# Patient Record
Sex: Male | Born: 1980 | Hispanic: No | Marital: Married | Smoking: Never smoker
Health system: Southern US, Community
[De-identification: ages and names within clinical notes are randomized; demographics above are authoritative.]

## PROBLEM LIST (undated history)

## (undated) DIAGNOSIS — E785 Hyperlipidemia, unspecified: Secondary | ICD-10-CM

## (undated) HISTORY — DX: Hyperlipidemia, unspecified: E78.5

---

## 2016-11-05 ENCOUNTER — Ambulatory Visit
Admission: RE | Admit: 2016-11-05 | Discharge: 2016-11-05 | Disposition: A | Discharge status: Home / Self Care | Payer: No Typology Code available for payment source | Source: Ambulatory Visit | Attending: Occupational Medicine | Admitting: Occupational Medicine

## 2016-11-05 ENCOUNTER — Other Ambulatory Visit: Payer: Self-pay | Admitting: Occupational Medicine

## 2016-11-05 DIAGNOSIS — Z021 Encounter for pre-employment examination: Secondary | ICD-10-CM

## 2018-07-14 IMAGING — CR DG CHEST 1V
1 series · 1 of 1 positions shown · non-contrast
Comparison: None.

CLINICAL DATA: Pre deployment examination.

EXAM:
CHEST 1 VIEW

[w chest pa]
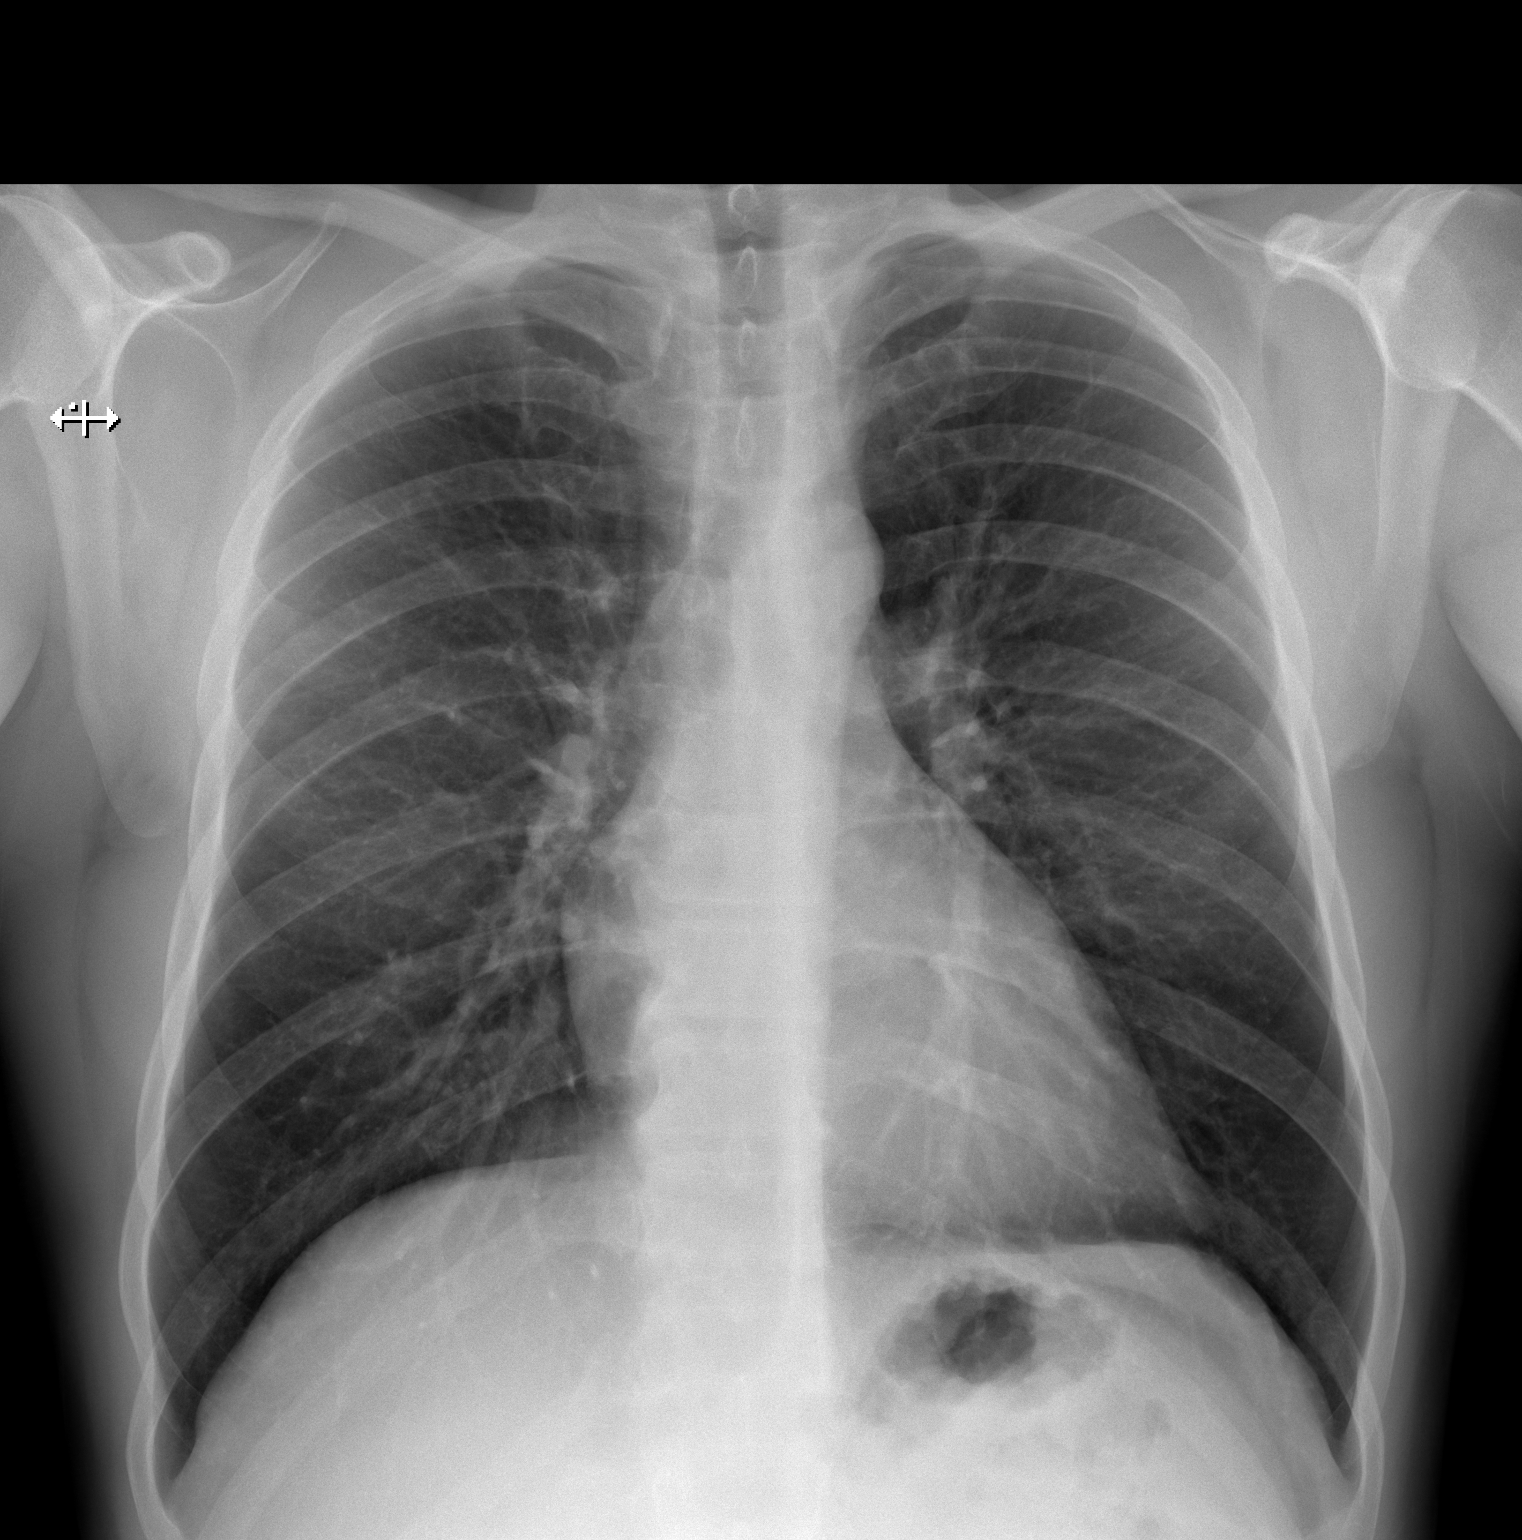

[1 of 1 positions shown; findings below may reference images not displayed]

FINDINGS: The heart size and mediastinal contours are within normal limits.
Both lungs are clear. The visualized skeletal structures are
unremarkable.
IMPRESSION: No active disease.

## 2019-01-04 ENCOUNTER — Other Ambulatory Visit: Payer: Self-pay

## 2019-01-04 ENCOUNTER — Encounter (HOSPITAL_COMMUNITY): Payer: Self-pay

## 2019-01-04 ENCOUNTER — Ambulatory Visit (HOSPITAL_COMMUNITY)
Admission: EM | Admit: 2019-01-04 | Discharge: 2019-01-04 | Disposition: A | Discharge status: Home / Self Care | Payer: No Typology Code available for payment source | Attending: Internal Medicine | Admitting: Internal Medicine

## 2019-01-04 DIAGNOSIS — W269XXA Contact with unspecified sharp object(s), initial encounter: Secondary | ICD-10-CM

## 2019-01-04 DIAGNOSIS — S61215A Laceration without foreign body of left ring finger without damage to nail, initial encounter: Secondary | ICD-10-CM | POA: Diagnosis not present

## 2019-01-04 DIAGNOSIS — S61214A Laceration without foreign body of right ring finger without damage to nail, initial encounter: Secondary | ICD-10-CM

## 2019-01-04 MED ORDER — LIDOCAINE HCL (PF) 1 % IJ SOLN
INTRAMUSCULAR | Status: AC
  Filled 2019-01-04: qty 30

## 2019-01-04 NOTE — ED Provider Notes (Addendum)
[place]    CSN: [id number] Arrival date & time: 01/04/19  1704      History   Chief Complaint Chief Complaint  Patient presents with  . Extremity Laceration    HPI Terry Porter is a 38 y.o. male with no past medical history comes to urgent care for left ring Finger laceration that occurred this afternoon.  Injury was accidental.  Bleeding is currently stopped.  Patient has pain which is sharp, constant and severe.  Aggravated by palpation and movement of the fingers.  No known relieving factors.  Patient was at rest the shopping Fruit or Vegetables When He Sustained the Cut.   HPI  History reviewed. No pertinent past medical history.  There are no active problems to display for this patient.   History reviewed. No pertinent surgical history.     Home Medications    Prior to Admission medications   Not on File    Family History Family History  Problem Relation Age of Onset  . Healthy Mother     Social History Social History   Tobacco Use  . Smoking status: Never Smoker  . Smokeless tobacco: Never Used  Substance Use Topics  . Alcohol use: Yes    Comment: sometimes  . Drug use: Not on file     Allergies   Patient has no known allergies.   Review of Systems Review of Systems  Constitutional: Negative.   HENT: Negative.   Respiratory: Negative.   Gastrointestinal: Negative.   Musculoskeletal: Negative.   Skin: Positive for color change and wound.  Neurological: Negative.      Physical Exam Triage Vital Signs ED Triage Vitals  Enc Vitals Group     BP 01/04/19 1727 (!) 155/99     Pulse Rate 01/04/19 1727 73     Resp 01/04/19 1727 15     Temp 01/04/19 1727 98.4 F (36.9 C)     Temp Source 01/04/19 1727 Temporal     SpO2 01/04/19 1727 100 %     Weight --      Height --      Head Circumference --      Peak Flow --      Pain Score 01/04/19 1726 3     Pain Loc --      Pain Edu? --      Excl. in [place]? --    No data  found.  Updated Vital Signs BP (!) 155/99 (BP Location: Right Arm)   Pulse 73   Temp 98.4 F (36.9 C) (Temporal)   Resp 15   SpO2 100%   Visual Acuity Right Eye Distance:   Left Eye Distance:   Bilateral Distance:    Right Eye Near:   Left Eye Near:    Bilateral Near:     Physical Exam Vitals signs and nursing note reviewed.  Constitutional:      General: He is in acute distress.     Appearance: He is normal weight. He is not ill-appearing.  Cardiovascular:     Rate and Rhythm: Normal rate and regular rhythm.     Pulses: Normal pulses.     Heart sounds: Normal heart sounds.  Pulmonary:     Effort: Pulmonary effort is normal.     Breath sounds: Normal breath sounds.  Musculoskeletal:     Comments: Laceration over the distal anterior aspect of the left ring finger.  No bleeding at this time.  Finger is swollen.  The finger nail and nailbed  is intact.  Skin:    General: Skin is warm and dry.     Capillary Refill: Capillary refill takes less than 2 seconds.  Neurological:     General: No focal deficit present.     Mental Status: He is alert.      UC Treatments / Results  Labs (all labs ordered are listed, but only abnormal results are displayed) Labs Reviewed - No data to display  EKG   Radiology No results found.  Procedures Laceration Repair  Date/Time: 01/04/2019 6:20 PM Performed by: Merrilee JanskyLamptey, Eathel Pajak O, MD Authorized by: Merrilee JanskyLamptey, Norrine Ballester O, MD   Consent:    Consent obtained:  Verbal   Consent given by:  Patient   Risks discussed:  Infection, pain, poor cosmetic result and tendon damage   Alternatives discussed:  No treatment and delayed treatment Anesthesia (see MAR for exact dosages):    Anesthesia method:  Local infiltration   Local anesthetic:  Lidocaine 1% w/o epi Laceration details:    Location:  Finger   Finger location:  L ring finger   Length (cm):  3   Depth (mm):  5 Repair type:    Repair type:  Simple Pre-procedure details:     Preparation:  Patient was prepped and draped in usual sterile fashion Exploration:    Hemostasis achieved with:  Direct pressure   Wound exploration: wound explored through full range of motion     Contaminated: no   Treatment:    Area cleansed with:  Betadine and saline   Amount of cleaning:  Standard   Irrigation solution:  Sterile saline   Visualized foreign bodies/material removed: no   Skin repair:    Repair method:  Sutures   Suture size:  4-0   Suture material:  Prolene   Suture technique:  Simple interrupted Approximation:    Approximation:  Close Post-procedure details:    Dressing:  Non-adherent dressing   Patient tolerance of procedure:  Tolerated well, no immediate complications   (including critical care time)  Medications Ordered in UC Medications  lidocaine (PF) (XYLOCAINE) 1 % injection (has no administration in time range)    Initial Impression / Assessment and Plan / UC Course  I have reviewed the triage vital signs and the nursing notes.  Pertinent labs & imaging results that were available during my care of the patient were reviewed by me and considered in my medical decision making (see chart for details).     1.  Laceration over the left ring finger. Laceration repair carried out Over-the-counter pain medications as needed Suture removal in 5 to 7 days Suture wound care education was given to the patient If patient notices worsening swelling, pain, redness, fever or chills or any other signs of infection patient is advised to return to urgent care to be reevaluated. Final Clinical Impressions(s) / UC Diagnoses   Final diagnoses:  Laceration of right ring finger without foreign body without damage to nail, initial encounter   Discharge Instructions   None    ED Prescriptions    None     Controlled Substance Prescriptions [place] Controlled Substance Registry consulted? No   Merrilee JanskyLamptey, Madelynne Lasker O, MD 01/09/19 1047    Merrilee JanskyLamptey, Draiden Mirsky O, MD  01/09/19 1050

## 2019-01-04 NOTE — ED Triage Notes (Signed)
Patient presents to Urgent Care with complaints of laceration from knife to left ring fingger since this afternoon. 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 Pt states tetanus is up to date.

## 2022-03-03 NOTE — Progress Notes (Unsigned)
HPI: [name] is a 41 y.o. male with medical hx significant for HLD here today to establish care.  Former PCP: N/A Last preventive routine visit: Many years ago. He has annual exams through work that include blood work and vision.  He reports no history of hypertension, diabetes, or depression.  He had FLP done in 02/2021: ***  level of around 212-220 during his last physical exam in November of the previous year. 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  He has a family history of drug use in his father, who is deceased, and a healthy 51 year old mother who is still working. There is no family history of cancer.  Review of Systems Rest see pertinent positives and negatives per HPI.  No current outpatient medications on file prior to visit.   No current facility-administered medications on file prior to visit.   History reviewed. No pertinent past medical history. No Known Allergies  Family History  Problem Relation Age of Onset   Healthy Mother    Social History   Socioeconomic History   Marital status: Married    Spouse name: Not on file   Number of children: Not on file   Years of education: Not on file   Highest education level: Not on file  Occupational History   Not on file  Tobacco Use   Smoking status: Never   Smokeless tobacco: Never  Vaping Use   Vaping Use: Never used  Substance and Sexual Activity   Alcohol use: Yes    Comment: sometimes   Drug use: Not on file   Sexual activity: Not on file  Other Topics Concern   Not on file  Social History Narrative   Not on file   Social Determinants of Health   Financial Resource Strain: Not on file  Food Insecurity: Not on file  Transportation Needs: Not on file  Physical Activity: Not on file  Stress: Not on file  Social Connections: Not on file   Vitals:   03/05/22 1110  BP: 120/80  Pulse: 70  Resp: 12  SpO2: 97%  Body mass index is 28.84 kg/m.  Physical  Exam Nursing note reviewed.  Constitutional:      General: He is not in acute distress.    Appearance: He is well-developed.  HENT:     Head: Normocephalic and atraumatic.  Eyes:     Conjunctiva/sclera: Conjunctivae normal.  Cardiovascular:     Rate and Rhythm: Normal rate and regular rhythm.     Pulses:          Dorsalis pedis pulses are 2+ on the right side and 2+ on the left side.     Heart sounds: No murmur heard. Pulmonary:     Effort: Pulmonary effort is normal. No respiratory distress.     Breath sounds: Normal breath sounds.  Abdominal:     Palpations: Abdomen is soft. There is no hepatomegaly or mass.     Tenderness: There is no abdominal tenderness.  Lymphadenopathy:     Cervical: No cervical adenopathy.  Skin:    General: Skin is warm.     Findings: No erythema or rash.  Neurological:     Mental Status: He is alert and oriented to person, place, and time.     Cranial Nerves: No cranial nerve deficit.     Gait: Gait normal.  Psychiatric:     Comments: Well groomed, good eye contact.   ASSESSMENT AND PLAN:  [name] was seen today for  establish care.  Diagnoses and all orders for this visit:  Hyperlipidemia, unspecified hyperlipidemia type  Tdap 5 years. *** Return in about 1 year (around 03/06/2023) for cpe.  Terry Govan G. [place], MD  [facility]. [place] office.

## 2022-03-05 ENCOUNTER — Ambulatory Visit (INDEPENDENT_AMBULATORY_CARE_PROVIDER_SITE_OTHER): Payer: 59 | Admitting: Family Medicine

## 2022-03-05 ENCOUNTER — Encounter: Payer: Self-pay | Admitting: Family Medicine

## 2022-03-05 VITALS — BP 120/80 | HR 70 | Resp 12 | Ht 70.0 in | Wt 201.0 lb

## 2022-03-05 DIAGNOSIS — E785 Hyperlipidemia, unspecified: Secondary | ICD-10-CM

## 2022-03-05 NOTE — Patient Instructions (Addendum)
A few things to remember from today's visit:  Hyperlipidemia, unspecified hyperlipidemia type  If you can have blood work results faxed to mailed to [place], so we can see the numbers. I think annual visits are appropriate. We can do fasting labs next year.  Do not use My Chart to request refills or for acute issues that need immediate attention. If you send a my chart message, it may take a few days to be addressed, specially if I am not in the office.  Please be sure medication list is accurate. If a new problem present, please set up appointment sooner than planned today.

## 2022-03-06 ENCOUNTER — Encounter: Payer: Self-pay | Admitting: Family Medicine

## 2022-03-08 ENCOUNTER — Ambulatory Visit: Payer: Self-pay | Admitting: Family Medicine

## 2022-04-27 ENCOUNTER — Telehealth: Payer: Self-pay

## 2022-04-27 NOTE — Telephone Encounter (Signed)
Can you set him up for a follow up appointment to discuss the EKG that he dropped off? It can be virtual if that is easier for him.

## 2022-04-27 NOTE — Telephone Encounter (Signed)
I spoke with patient. He is not having any symptoms and per pcp's note, referral is not needed as long as no symptoms.

## 2022-12-12 LAB — CBC AND DIFFERENTIAL
HCT: 43 (ref 41–53)
Hemoglobin: 14.7 (ref 13.5–17.5)
Platelets: 241 10*3/uL (ref 150–400)
WBC: 6.4

## 2022-12-12 LAB — BASIC METABOLIC PANEL
BUN: 23 — AB (ref 4–21)
CO2: 27 — AB (ref 13–22)
Chloride: 106 (ref 99–108)
Creatinine: 1.1 (ref 0.6–1.3)
Glucose: 85
Potassium: 4.6 meq/L (ref 3.5–5.1)
Sodium: 140 (ref 137–147)

## 2022-12-12 LAB — HEPATIC FUNCTION PANEL
ALT: 22 U/L (ref 10–40)
AST: 20 (ref 14–40)
Alkaline Phosphatase: 59 (ref 25–125)
Bilirubin, Total: 0.8

## 2022-12-12 LAB — LIPID PANEL
Cholesterol: 239 — AB (ref 0–200)
HDL: 43 (ref 35–70)
LDL Cholesterol: 162
LDl/HDL Ratio: 5.6
Triglycerides: 188 — AB (ref 40–160)

## 2022-12-12 LAB — COMPREHENSIVE METABOLIC PANEL
Albumin: 4.6 (ref 3.5–5.0)
Calcium: 9.7 (ref 8.7–10.7)
EGFR: 90
Globulin: 2.5

## 2022-12-12 LAB — CBC: RBC: 5 (ref 3.87–5.11)

## 2022-12-12 LAB — PSA: PSA: 0.48

## 2023-03-30 ENCOUNTER — Encounter: Payer: Self-pay | Admitting: Family Medicine

## 2023-03-30 ENCOUNTER — Ambulatory Visit: Payer: 59 | Admitting: Family Medicine

## 2023-03-30 VITALS — BP 124/80 | HR 76 | Resp 12 | Ht 70.0 in | Wt 197.0 lb

## 2023-03-30 DIAGNOSIS — E785 Hyperlipidemia, unspecified: Secondary | ICD-10-CM | POA: Diagnosis not present

## 2023-03-30 DIAGNOSIS — Z1159 Encounter for screening for other viral diseases: Secondary | ICD-10-CM | POA: Diagnosis not present

## 2023-03-30 DIAGNOSIS — Z0000 Encounter for general adult medical examination without abnormal findings: Secondary | ICD-10-CM | POA: Diagnosis not present

## 2023-03-30 DIAGNOSIS — M25671 Stiffness of right ankle, not elsewhere classified: Secondary | ICD-10-CM

## 2023-03-30 NOTE — Assessment & Plan Note (Signed)
Problem seems to be getting worse, LDL went from 146 to 162, not HDL 196.  We discussed pharmacologic treatment options, he is not interested in starting medication. He will try to work on his diet and would like to have lab repeated in 6 months.

## 2023-03-30 NOTE — Progress Notes (Signed)
HPI: [name] is a 42 y.o.male with a PMHx significant for HLD who is here today for his routine physical examination.  Last CPE: not on file  Exercise: Patient states he runs 7-9 times per week for at least 8 miles and also lifts weights.  Diet: He eats healthy in general. He eats a combination of chicken, fish, and red meat, and eats vegetables daily.  Sleep: His sleep schedule is sligthly varied with his work, but he says he averages a little over 7 hours.  Alcohol Use: He says he drinks 1-2 times per month.  Smoking: never Vision: He has his eyes checked through his job, but they aren't dilated.  Dental: UTD on routine dental care.    There is no immunization history on file for this patient.  Health Maintenance  Topic Date Due   Hepatitis C Screening  Never done   DTaP/Tdap/Td (1 - Tdap) Never done   COVID-19 Vaccine (1 - 2023-24 season) Never done   INFLUENZA VACCINE  07/25/2023 (Originally 11/25/2022)   HIV Screening  03/07/2027 (Originally 02/05/1996)   HPV VACCINES  Aged Out   Last prostate ca screening:  His PSA was 0.4 on 11/29/2022, this is part of labs he has annually through work.  Chronic medical problems:   Hyperlipidemia: Not currently on pharmacologic treatment.  Last FLP through work Fasting glucose 85.  Lab Results  Component Value Date   CHOL 239 (A) 12/12/2022   HDL 43 12/12/2022   LDLCALC 162 12/12/2022   TRIG 188 (A) 12/12/2022   Non HDL Cholesterol: 196 Lab Results  Component Value Date   ALT 22 12/12/2022   AST 20 12/12/2022   ALKPHOS 59 12/12/2022   Lab Results  Component Value Date   NA 140 12/12/2022   CL 106 12/12/2022   K 4.6 12/12/2022   CO2 27 (A) 12/12/2022   BUN 23 (A) 12/12/2022   CREATININE 1.1 12/12/2022   CALCIUM 9.7 12/12/2022   ALBUMIN 4.6 12/12/2022   Right ankle range of motion:  He asks about a referral to discuss non invasive treatment options for limited range of motion in his right ankle for 13  years since an injury while playing soccer. He denies pain but after a long run he can feel some achy pain in right hip and lower back, which he thinks are related. Negative for edema or erythema.  Review of Systems  Constitutional:  Negative for activity change, appetite change and fever.  HENT:  Negative for nosebleeds, sore throat and trouble swallowing.   Eyes:  Negative for redness and visual disturbance.  Respiratory:  Negative for cough, shortness of breath and wheezing.   Cardiovascular:  Negative for chest pain, palpitations and leg swelling.  Gastrointestinal:  Negative for abdominal pain, blood in stool, nausea and vomiting.  Endocrine: Negative for cold intolerance, heat intolerance, polydipsia, polyphagia and polyuria.  Genitourinary:  Negative for decreased urine volume, dysuria, genital sores, hematuria and testicular pain.  Musculoskeletal:  Negative for gait problem and joint swelling.  Skin:  Negative for color change and rash.  Allergic/Immunologic: Negative for environmental allergies.  Neurological:  Negative for dizziness, syncope, weakness, numbness and headaches.  Hematological:  Negative for adenopathy. Does not bruise/bleed easily.  Psychiatric/Behavioral:  Negative for confusion and sleep disturbance.   All other systems reviewed and are negative.  No current outpatient medications on file prior to visit.   No current facility-administered medications on file prior to visit.   Past Medical History:  Diagnosis Date   Hyperlipidemia    History reviewed. No pertinent surgical history.  No Known Allergies  Family History  Problem Relation Age of Onset   Healthy Mother    Social History   Socioeconomic History   Marital status: Married    Spouse name: Not on file   Number of children: Not on file   Years of education: Not on file   Highest education level: Not on file  Occupational History   Not on file  Tobacco Use   Smoking status: Never    Smokeless tobacco: Never  Vaping Use   Vaping status: Never Used  Substance and Sexual Activity   Alcohol use: Yes    Comment: sometimes   Drug use: Not on file   Sexual activity: Not on file  Other Topics Concern   Not on file  Social History Narrative   Not on file   Social Determinants of Health   Financial Resource Strain: Not on file  Food Insecurity: Not on file  Transportation Needs: Not on file  Physical Activity: Not on file  Stress: Not on file  Social Connections: Not on file   Vitals:   03/30/23 1351  BP: 124/80  Pulse: 76  Resp: 12  SpO2: 98%   Body mass index is 28.27 kg/m.  Wt Readings from Last 3 Encounters:  03/30/23 197 lb (89.4 kg)  03/05/22 201 lb (91.2 kg)   Physical Exam Vitals and nursing note reviewed.  Constitutional:      General: He is not in acute distress.    Appearance: He is well-developed.  HENT:     Head: Normocephalic and atraumatic.     Right Ear: Tympanic membrane, ear canal and external ear normal.     Left Ear: Tympanic membrane, ear canal and external ear normal.     Mouth/Throat:     Mouth: Mucous membranes are moist.     Pharynx: Oropharynx is clear. Uvula midline.  Eyes:     Extraocular Movements:     Right eye: Abnormal extraocular motion (Strabismus) present.     Conjunctiva/sclera: Conjunctivae normal.     Pupils: Pupils are equal, round, and reactive to light.     Comments: .  Neck:     Thyroid: No thyroid mass or thyromegaly.  Cardiovascular:     Rate and Rhythm: Normal rate and regular rhythm.     Pulses:          Dorsalis pedis pulses are 2+ on the right side and 2+ on the left side.     Heart sounds: No murmur heard. Pulmonary:     Effort: Pulmonary effort is normal. No respiratory distress.     Breath sounds: Normal breath sounds.  Abdominal:     Palpations: Abdomen is soft. There is no hepatomegaly or mass.     Tenderness: There is no abdominal tenderness.  Genitourinary:    Comments: No  concerns. Musculoskeletal:        General: No tenderness.     Cervical back: Normal range of motion.     Right lower leg: No edema.     Left lower leg: No edema.     Comments: No major deformities appreciated and no signs of synovitis. No significant limitation of right ankle flexion. No pain with ROM.  Lymphadenopathy:     Cervical: No cervical adenopathy.     Upper Body:     Right upper body: No supraclavicular adenopathy.     Left upper body: No  supraclavicular adenopathy.  Skin:    General: Skin is warm.     Findings: No erythema.  Neurological:     General: No focal deficit present.     Mental Status: He is alert and oriented to person, place, and time.     Cranial Nerves: No cranial nerve deficit.     Sensory: No sensory deficit.     Motor: No weakness.     Gait: Gait normal.     Deep Tendon Reflexes: Reflexes are normal and symmetric.     Reflex Scores:      Bicep reflexes are 2+ on the right side and 2+ on the left side.      Patellar reflexes are 2+ on the right side and 2+ on the left side. Psychiatric:        Mood and Affect: Mood and affect normal.   ASSESSMENT AND PLAN:  Mr. Pande was seen today for his routine annual physical examination.   Orders Placed This Encounter  Procedures   Lipid panel   Hepatitis C antibody   Ambulatory referral to Sports Medicine   02/2021: TC 209, TG 139, LDL 146, and HDL 37.  Routine general medical examination at a health care facility Assessment & Plan: We discussed the importance of regular physical activity and healthy diet for prevention of chronic illness and/or complications. Preventive guidelines reviewed. Vaccination up to date, next Tdap is due in 02/2026. Next CPE in a year.   Hyperlipidemia, unspecified hyperlipidemia type Assessment & Plan: Problem seems to be getting worse, LDL went from 146 to 162, not HDL 196.  We discussed pharmacologic treatment options, he is not interested in starting  medication. He will try to work on his diet and would like to have lab repeated in 6 months.  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 Appt with sport medicine will be arranged.   -     Ambulatory referral to Sports Medicine  Return in 1 year (on 03/29/2024) for CPE. Fasting labs in 6 months.Trula Ore, acting as a scribe for Agapito Hanway [place], MD., have documented all relevant documentation on the behalf of Rayhan Groleau [place], MD, as directed by  Habib Kise [place], MD while in the presence of Havish Petties [place], MD.   I, Shadow Stiggers [place], MD, have reviewed all documentation for this visit. The documentation on 03/30/23 for the exam, diagnosis, procedures, and orders are all accurate and complete.  Dontavion Noxon G. [place], MD  [facility]. [place] office.

## 2023-03-30 NOTE — Patient Instructions (Addendum)
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 Do not use My Chart to request refills or for acute issues that need immediate attention. If you send a my chart message, it may take a few days to be addressed, specially if I am not in the office.  Please be sure medication list is accurate. If a new problem present, please set up appointment sooner than planned today.  Health Maintenance, Male Adopting a healthy lifestyle and getting preventive care are important in promoting health and wellness. Ask your health care provider about: The right schedule for you to have regular tests and exams. Things you can do on your own to prevent diseases and keep yourself healthy. What should I know about diet, weight, and exercise? Eat a healthy diet  Eat a diet that includes plenty of vegetables, fruits, low-fat dairy products, and lean protein. Do not eat a lot of foods that are high in solid fats, added sugars, or sodium. Maintain a healthy weight Body mass index (BMI) is a measurement that can be used to identify possible weight problems. It estimates body fat based on height and weight. Your health care provider can help determine your BMI and help you achieve or maintain a healthy weight. Get regular exercise Get regular exercise. This is one of the most important things you can do for your health. Most adults should: Exercise for at least 150 minutes each week. The exercise should increase your heart rate and make you sweat (moderate-intensity exercise). Do strengthening exercises at least twice a week. This is in addition to the  moderate-intensity exercise. Spend less time sitting. Even light physical activity can be beneficial. Watch cholesterol and blood lipids Have your blood tested for lipids and cholesterol at 44 years of age, then have this test every 5 years. You may need to have your cholesterol levels checked more often if: Your lipid or cholesterol levels are high. You are older than 44 years of age. You are at high risk for heart disease. What should I know about cancer screening? Many types of cancers can be detected early and may often be prevented. Depending on your health history and family history, you may need to have cancer screening at various ages. This may include screening for: Colorectal cancer. Prostate cancer. Skin cancer. Lung cancer. What should I know about heart disease, diabetes, and high blood pressure? Blood pressure and heart disease High blood pressure causes heart disease and increases the risk of stroke. This is more likely to develop in people who have high blood pressure readings or are overweight. Talk with your health care provider about your target blood pressure readings. Have your blood pressure checked: Every 3-5 years if you are 8-80 years of age. Every year if you are 46 years old or older. If you are between the ages of 61 and 16 and are a current or former smoker, ask your health care provider if you should have a one-time screening for abdominal aortic aneurysm (AAA). Diabetes Have regular diabetes screenings. This checks your fasting blood sugar level. Have the screening done: Once every three years after age 46 if you are at a normal  weight and have a low risk for diabetes. More often and at a younger age if you are overweight or have a high risk for diabetes. What should I know about preventing infection? Hepatitis B If you have a higher risk for hepatitis B, you should be screened for this virus. Talk with your health care provider to find out if you are at  risk for hepatitis B infection. Hepatitis C Blood testing is recommended for: Everyone born from [birth year] through 1967. Anyone with known risk factors for hepatitis C. Sexually transmitted infections (STIs) You should be screened each year for STIs, including gonorrhea and chlamydia, if: You are sexually active and are younger than 44 years of age. You are older than 44 years of age and your health care provider tells you that you are at risk for this type of infection. Your sexual activity has changed since you were last screened, and you are at increased risk for chlamydia or gonorrhea. Ask your health care provider if you are at risk. Ask your health care provider about whether you are at high risk for HIV. Your health care provider may recommend a prescription medicine to help prevent HIV infection. If you choose to take medicine to prevent HIV, you should first get tested for HIV. You should then be tested every 3 months for as long as you are taking the medicine. Follow these instructions at home: Alcohol use Do not drink alcohol if your health care provider tells you not to drink. If you drink alcohol: Limit how much you have to 0-2 drinks a day. Know how much alcohol is in your drink. In the [place] one drink equals one 12 oz bottle of beer (355 mL), one 5 oz glass of wine (148 mL), or one 1 oz glass of hard liquor (44 mL). Lifestyle Do not use any products that contain nicotine or tobacco. These products include cigarettes, chewing tobacco, and vaping devices, such as e-cigarettes. If you need help quitting, ask your health care provider. Do not use street drugs. Do not share needles. Ask your health care provider for help if you need support or information about quitting drugs. General instructions Schedule regular health, dental, and eye exams. Stay current with your vaccines. Tell your health care provider if: You often feel depressed. You have ever been abused or do not feel safe  at home. Summary Adopting a healthy lifestyle and getting preventive care are important in promoting health and wellness. Follow your health care provider's instructions about healthy diet, exercising, and getting tested or screened for diseases. Follow your health care provider's instructions on monitoring your cholesterol and blood pressure. This information is not intended to replace advice given to you by your health care provider. Make sure you discuss any questions you have with your health care provider. Document Revised: 09/01/2020 Document Reviewed: 09/01/2020 Elsevier Patient Education  2024 [employer].

## 2023-03-30 NOTE — Assessment & Plan Note (Signed)
We discussed the importance of regular physical activity and healthy diet for prevention of chronic illness and/or complications. Preventive guidelines reviewed. Vaccination up to date, next Tdap is due in 02/2026. Next CPE in a year.

## 2023-04-05 ENCOUNTER — Other Ambulatory Visit: Payer: Self-pay

## 2023-04-05 ENCOUNTER — Encounter: Payer: Self-pay | Admitting: Family Medicine

## 2023-04-05 ENCOUNTER — Ambulatory Visit (INDEPENDENT_AMBULATORY_CARE_PROVIDER_SITE_OTHER): Payer: 59

## 2023-04-05 ENCOUNTER — Ambulatory Visit: Payer: 59 | Admitting: Family Medicine

## 2023-04-05 VITALS — BP 142/86 | HR 71 | Ht 70.0 in | Wt 198.0 lb

## 2023-04-05 DIAGNOSIS — G8929 Other chronic pain: Secondary | ICD-10-CM | POA: Insufficient documentation

## 2023-04-05 DIAGNOSIS — M25571 Pain in right ankle and joints of right foot: Secondary | ICD-10-CM

## 2023-04-05 NOTE — Progress Notes (Signed)
I, [name], CMA acting as a scribe for [name], MD.  Terry Porter is a 42 y.o. male who presents to [facility] at [facility] today for R ankle pain x several yers. He suffered an injury 13 years ago playing soccer. He is an avid runner; running at least 8 miles/day. Pt locates pain to top of the ankle. Denies swelling. Sx worse with bending, running.  He notes he lacks full ankle dorsiflexion.  He is running about 70 miles per week working on speed.  He runs a marathon under 4 hours.  His goal is to get under 3 hours and 30 minutes.  Next marathon is February 14.  Swelling: no Aggravates: bending Treatments tried: ice  Pertinent review of systems: No fevers or chills  Relevant historical information: Hyperlipidemia otherwise healthy   Exam:  BP (!) 142/86   Pulse 71   Ht 5\' 10"  (1.778 m)   Wt 198 lb (89.8 kg)   SpO2 98%   BMI 28.41 kg/m  General: Well Developed, well nourished, and in no acute distress.   MSK: Ankle some swelling.  Minimally tender palpation anterior lateral ankle.  Normal motion however patient lacks full complete dorsiflexion loaded compared to the left ankle. Intact strength without pain.  Stable ligamentous exam.    Lab and Radiology Results  Diagnostic Limited MSK Ultrasound of: Right ankle Calcific change present distal to the lateral malleolus at the ATFL area could represent old avulsion fracture.  Tendons of the anterior and lateral ankle are normal-appearing Impression: Possible avulsion fracture.   X-ray images right ankle obtained today personally and independently interpreted Anterior osteophytes or rounded bone fragments present at the anterior ankle. Mild degeneration present at the lateral ankle. Await formal radiology review   Assessment and Plan: 42 y.o. male with right ankle pain and stiffness especially with extended running.  He is successfully doing a great job of running running 80 miles a week  and completing marathons without significant pain.  He does have a little bit of lack of full dorsiflexion compared to his contralateral left side which may be a factor.  I think he has an old injury and that is what he is feeling.  Plan for some home exercise program today and compression ankle sleeve.  Recheck in about 2 months if not improved we can do an injection or advanced imaging like an MRI or even formal physical therapy.   PDMP not reviewed this encounter. Orders Placed This Encounter  Procedures   [place] LIMITED JOINT SPACE STRUCTURES LOW RIGHT(NO LINKED CHARGES)    Order Specific Question:   Reason for Exam (SYMPTOM  OR DIAGNOSIS REQUIRED)    Answer:   right ankle pain    Order Specific Question:   Preferred imaging location?    Answer:   [facility] Ankle Complete Right    Standing Status:   Future    Standing Expiration Date:   04/04/2024    Order Specific Question:   Reason for Exam (SYMPTOM  OR DIAGNOSIS REQUIRED)    Answer:   right ankle pain    Order Specific Question:   Preferred imaging location?    Answer:   [facility]   No orders of the defined types were placed in this encounter.    Discussed warning signs or symptoms. Please see discharge instructions. Patient expresses understanding.   The above documentation has been reviewed and is accurate and complete [name], M.D.

## 2023-04-05 NOTE — Patient Instructions (Addendum)
Thank you for coming in today.   Please get an Xray today before you leave   Please work on the home exercises the athletic trainer went over with you:  View at [email or web address]-exercise-[email or web address] using code: [id number]  I recommend you obtained a compression sleeve to help with your joint problems. There are many options on the market however I recommend obtaining a full ankle Body Helix compression sleeve.  You can find information (including how to appropriate measure yourself for sizing) can be found at www.Body [employer].ch.  Many of these products are health savings account (HSA) eligible.   You can use the compression sleeve at any time throughout the day but is most important to use while being active as well as for 2 hours post-activity.   It is appropriate to ice following activity with the compression sleeve in place.   [facility] would be a good option if you cannot improve enough.   Recheck in 2 weeks.

## 2023-04-11 NOTE — Progress Notes (Signed)
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

## 2023-04-11 NOTE — Progress Notes (Signed)
Right ankle shows some spurring in the ankle joint well with some arthritis.

## 2023-07-25 NOTE — Progress Notes (Signed)
   ACUTE VISIT No chief complaint on file.  HPI: Terry Porter is a 43 y.o. male with a PMHx significant for HLD, who is here today complaining of headaches.   Review of Systems See other pertinent positives and negatives in HPI.  No current outpatient medications on file prior to visit.   No current facility-administered medications on file prior to visit.   Past Medical History:  Diagnosis Date   Hyperlipidemia    No Known Allergies  Social History   Socioeconomic History   Marital status: Married    Spouse name: Not on file   Number of children: Not on file   Years of education: Not on file   Highest education level: Not on file  Occupational History   Not on file  Tobacco Use   Smoking status: Never   Smokeless tobacco: Never  Vaping Use   Vaping status: Never Used  Substance and Sexual Activity   Alcohol use: Yes    Comment: sometimes   Drug use: Not on file   Sexual activity: Not on file  Other Topics Concern   Not on file  Social History Narrative   Not on file   Social Drivers of Health   Financial Resource Strain: Not on file  Food Insecurity: Not on file  Transportation Needs: Not on file  Physical Activity: Not on file  Stress: Not on file  Social Connections: Not on file   There were no vitals filed for this visit. There is no height or weight on file to calculate BMI.  Physical Exam  ASSESSMENT AND PLAN:  Terry Porter was seen today for headaches.   There are no diagnoses linked to this encounter.  No follow-ups on file.  I, Terry Porter, acting as a scribe for Terry Barrales [place], MD., have documented all relevant documentation on the behalf of Terry Karaffa [place], MD, as directed by  Terry Dossett [place], MD while in the presence of Terry Marciel [place], MD.   I, Terry Batres [place], MD, have reviewed all documentation for this visit. The documentation on 07/25/23 for the exam, diagnosis, procedures, and orders are all accurate and complete.  Terry Ozturk G.  [place], MD  [facility] [place] office.  Discharge Instructions   None

## 2023-07-26 ENCOUNTER — Encounter: Payer: Self-pay | Admitting: Family Medicine

## 2023-07-26 ENCOUNTER — Ambulatory Visit: Admitting: Family Medicine

## 2023-07-26 VITALS — BP 128/70 | HR 91 | Resp 12 | Ht 70.0 in | Wt 208.0 lb

## 2023-07-26 DIAGNOSIS — R519 Headache, unspecified: Secondary | ICD-10-CM | POA: Diagnosis not present

## 2023-07-26 NOTE — Patient Instructions (Addendum)
 A few things to remember from today's visit:  Headache, unspecified headache type ? Tension headache. Monitor for new symptoms. I do not think imaging or neuro referral are needed at this time. If starts getting worse again or new symptoms, we can arrange for head imaging and try medications.  If you need refills for medications you take chronically, please call your pharmacy. Do not use My Chart to request refills or for acute issues that need immediate attention. If you send a my chart message, it may take a few days to be addressed, specially if I am not in the office.  Please be sure medication list is accurate. If a new problem present, please set up appointment sooner than planned today.

## 2023-11-25 ENCOUNTER — Other Ambulatory Visit (HOSPITAL_BASED_OUTPATIENT_CLINIC_OR_DEPARTMENT_OTHER): Payer: Self-pay | Admitting: Family Medicine

## 2023-11-25 DIAGNOSIS — Z8249 Family history of ischemic heart disease and other diseases of the circulatory system: Secondary | ICD-10-CM

## 2023-11-29 ENCOUNTER — Ambulatory Visit (HOSPITAL_BASED_OUTPATIENT_CLINIC_OR_DEPARTMENT_OTHER)
Admission: RE | Admit: 2023-11-29 | Discharge: 2023-11-29 | Disposition: A | Discharge status: Home / Self Care | Payer: Self-pay | Source: Ambulatory Visit | Attending: Family Medicine | Admitting: Family Medicine

## 2023-11-29 DIAGNOSIS — Z8249 Family history of ischemic heart disease and other diseases of the circulatory system: Secondary | ICD-10-CM
